# Patient Record
Sex: Male | Born: 1960 | Marital: Married
Health system: Southern US, Community
[De-identification: ages and names within clinical notes are randomized; demographics above are authoritative.]

---

## 2016-08-12 ENCOUNTER — Inpatient Hospital Stay
Admit: 2016-08-12 | Discharge: 2016-08-12 | Disposition: A | Discharge status: Home / Self Care | Attending: Emergency Medicine

## 2016-08-12 DIAGNOSIS — M5441 Lumbago with sciatica, right side: Secondary | ICD-10-CM

## 2016-08-12 MED ORDER — PREDNISONE 20 MG PO TABS
20 MG | ORAL_TABLET | Freq: Every day | ORAL | 0 refills | Status: AC
Start: 2016-08-12 — End: 2016-08-17

## 2016-08-12 MED ORDER — IBUPROFEN 800 MG PO TABS
800 MG | ORAL_TABLET | Freq: Three times a day (TID) | ORAL | 0 refills | Status: AC | PRN
Start: 2016-08-12 — End: 2016-08-19

## 2016-08-12 NOTE — Discharge Instructions (Signed)
Follow-up with her primary care physician or return to the ER if worse in any way.

## 2016-08-12 NOTE — ED Provider Notes (Signed)
Triage Chief Complaint:   Back Pain      HOPI:  Jeffery Powell is a 56 y.o. male that presents for:  HISTORY OF PRESENTING ILLNESS    Chief complaint:  Back pain    Location:  R Lumbar region    Severity:  Moderate    Duration:  Constant in duration    Associated Symptoms:  Patient does have R radicular symptoms, he states that in the past when he sat back flareups that the radicular symptoms have been present in the past as well.  Denies saddle anesthesia, bowel/bladder dysfunction.    Alleviating/Aggravating Factors:  Worse with flexion.    Review of Medical Records Pertinent To Case:  n/a      ROS:    Constitutional: No fevers/chills.  No weight changes.  No night sweats.  Eyes:  No blurry vision or double vision.  No drainage.   Ears, Nose, Throat:  No hearing changes.  No rhinitis.  No sore throat or cough.  Cardiac: No chest pain or pressure.  No arm/jaw pain.  No palpitations.  No lightheadedness.  No claudication symptoms.  Respiratory:  No dyspnea.  No hemoptysis.  GI: No abdominal pain.  No n/v/d.  No hematochezia or melena.   GU:  No hematuria.  No dysuria. No discharge.  MSK:  No joint pain or swelling.  No lower extremity swelling. + Back pain as above.  Skin:  No rashes.  No pruritis.   Neuro:  No numbness, tingling or weakness in any extremity or face.  No headache.  No incoordination.  No speech difficulties.  No seizures.     At least 10 point systems reviewed and otherwise acutely negative except as in the HOPI.    Past Medical History:   Diagnosis Date   . Hyperlipidemia    . Hypertension      No past surgical history on file.  No family history on file.  Social History     Social History   . Marital status: Married     Spouse name: N/A   . Number of children: N/A   . Years of education: N/A     Occupational History   . Not on file.     Social History Main Topics   . Smoking status: Not on file   . Smokeless tobacco: Not on file   . Alcohol use Not on file   . Drug use: Unknown   . Sexual  activity: Not on file     Other Topics Concern   . Not on file     Social History Narrative   . No narrative on file     No current facility-administered medications for this encounter.      Current Outpatient Prescriptions   Medication Sig Dispense Refill   . ibuprofen (ADVIL;MOTRIN) 800 MG tablet Take 1 tablet by mouth every 8 hours as needed for Pain 20 tablet 0   . predniSONE (DELTASONE) 20 MG tablet Take 3 tablets by mouth daily for 5 days 15 tablet 0     Allergies   Allergen Reactions   . Aspirin Anaphylaxis     Nursing Notes Reviewed    Physical Exam:  ED Triage Vitals [08/12/16 1137]   Enc Vitals Group      BP (!) 169/112      Pulse 84      Resp 16      Temp 98.1 F (36.7 C)      Temp Source Oral  SpO2 98 %      Weight 220 lb (99.8 kg)      Height 6' (1.829 m)      Head Circumference       Peak Flow       Pain Score       Pain Loc       Pain Edu?       Excl. in GC?      GENERAL APPEARANCE: alert, lying supine in gurney, cooperative with exam.  HEAD: normocephalic, atraumatic  EYES:  EOMI, conjunctiva clear.  NOSE: no nasal discharge.  MOUTH: moist mucous membranes  NECK: supple, no neck tenderness, normal ROM, no JVD  BACK: no back tenderness. no CVA tenderness       No saddle anesthesia.  2+patellar reflexes.  Strong plantar and dorsiflexion.  5/5 strength throughout lower extremity and full sensation throughout all dermatomes.  Neg straight and cross leg testing.  Musculoskeletal exam reveals no ttp or step offs along the midline spinous processes.   I can easily palpate a R lower lumbar paraspinal spasm which reproduces the patient's pain.  Negative straight and cross leg testing.  Patient defers rectal.  Able to ambulate without limp and without assistance.  Vascular exam wnl with strong and symmetric pulses proximally and distally, left to right.     LUNGS: no wheezing, no rales, no rhonchi, no accessory muscle use. good air  exchange bilateral.  HEART: normal rate, normal rhythm, no murmur, no  rub.  ABDOMEN: normal appearance, normal BS, soft, no abd tenderness, no guarding, no  organomegaly, no abd masses.  RECTAL: deffered  EXTREMITIES: no joint swelling\tenderness, no edema, normal ROM.  SKIN: warm, dry, good color, no rash.  NEURO: Alert and oriented, motor intact, sensory intact.    I have reviewed and interpreted all of the currently available lab results from this visit (if applicable):  No results found for this visit on 08/12/16.     Radiographs:  []  Radiologist's Report Reviewed:   No orders to display       EKG: (All EKG's are interpreted by myself in the absence of a cardiologist)    MDM:    BACK PAIN:    Neuro intact. No urinary retention or incont. Suspect MS strain vs. Disc herniation. Low risk for hematoma, abscess, pathologic fracture, mass. Do not suspect cord compression given normal exam.     No evid/hx sugg cauda eq/aaa/transv my/abscess ,spondylitis, hematoma, fracture, mass/malignancy, spinal artery infarct,  osteomyelitis/diskitis, spinal stenosis.      Discussed stretching exercises, early return to function.     As above it is unlikely that this patient has a serious or sinister condition causing the symptoms and most likely musculoskeletal v. possible disc herniation without evidence of cord compression- no focal neurological signs and no clinical suspicion for cauda equine, trauma, chronic steroid use, immunocompromise, autoimmune/connective tissue disorders, IVDU, anticoagulation use, no pain at rest.    Final Impression:  1. Acute right-sided low back pain with right-sided sciatica    2. Sciatica of right side        Discharge referral (if applicable):  No follow-up provider specified.    Discharge medications (if applicable):  New Prescriptions    IBUPROFEN (ADVIL;MOTRIN) 800 MG TABLET    Take 1 tablet by mouth every 8 hours as needed for Pain    PREDNISONE (DELTASONE) 20 MG TABLET    Take 3 tablets by mouth daily for 5 days       (Please  note that portions of this note may  have been completed with a voice recognition program. Efforts were made to edit the dictations but occasionally words are mis-transcribed.)    Elissa Heftyearikirangi E Chirstopher Iovino, MD              Elissa Heftyearikirangi E Cheikh Bramble, MD  08/12/16 1216

## 2016-08-12 NOTE — ED Notes (Signed)
D/c stable with scripts and follow up     Tresa EndoKelly A. Lottie MusselVanscoy, RN  08/12/16 1231

## 2016-08-12 NOTE — Other (Unsigned)
Patient Acct Nbr: [id number]   Primary AUTH/CERT:   Primary Insurance Company Name: [employer]  Primary Insurance Plan name: [employer]  Primary Insurance Group Number: [id number]  Primary Insurance Plan Type: Health  Primary Insurance Policy Number: [id number]

## 2017-10-27 DIAGNOSIS — E785 Hyperlipidemia, unspecified: Secondary | ICD-10-CM | POA: Insufficient documentation

## 2017-10-27 DIAGNOSIS — I10 Essential (primary) hypertension: Secondary | ICD-10-CM | POA: Insufficient documentation

## 2019-02-01 ENCOUNTER — Ambulatory Visit: Payer: Self-pay | Admitting: Family Medicine

## 2019-04-19 ENCOUNTER — Encounter: Payer: BLUE CROSS/BLUE SHIELD | Admitting: Family Medicine

## 2019-04-26 ENCOUNTER — Other Ambulatory Visit: Payer: Self-pay

## 2019-04-26 ENCOUNTER — Ambulatory Visit (INDEPENDENT_AMBULATORY_CARE_PROVIDER_SITE_OTHER): Payer: BLUE CROSS/BLUE SHIELD | Admitting: Family Medicine

## 2019-04-26 ENCOUNTER — Encounter: Payer: Self-pay | Admitting: Family Medicine

## 2019-04-26 VITALS — BP 142/94 | HR 72

## 2019-04-26 DIAGNOSIS — E785 Hyperlipidemia, unspecified: Secondary | ICD-10-CM | POA: Diagnosis not present

## 2019-04-26 DIAGNOSIS — R5383 Other fatigue: Secondary | ICD-10-CM

## 2019-04-26 DIAGNOSIS — M546 Pain in thoracic spine: Secondary | ICD-10-CM | POA: Diagnosis not present

## 2019-04-26 DIAGNOSIS — M545 Low back pain, unspecified: Secondary | ICD-10-CM

## 2019-04-26 DIAGNOSIS — G8929 Other chronic pain: Secondary | ICD-10-CM | POA: Insufficient documentation

## 2019-04-26 DIAGNOSIS — Z0000 Encounter for general adult medical examination without abnormal findings: Secondary | ICD-10-CM

## 2019-04-26 DIAGNOSIS — I10 Essential (primary) hypertension: Secondary | ICD-10-CM

## 2019-04-26 DIAGNOSIS — E349 Endocrine disorder, unspecified: Secondary | ICD-10-CM

## 2019-04-26 NOTE — Progress Notes (Signed)
Office Visit Note   Patient: John Frost           Date of Birth: November 09, 1960           MRN: [id number] Visit Date: 04/26/2019 Requested by: No referring provider defined for this encounter. PCP: Eunice Blase, MD  Subjective: Chief Complaint  Patient presents with  . establish primary care/bloodwork for lipids    HPI: He is here to establish care.  He is due for a wellness exam with labs.  He has a history of hypertension and hyperlipidemia.  He is on lisinopril and Lipitor for these.  Generally his blood pressure is well controlled.  He had an issue with left-sided chest pain for years ago.  It was severe and he went by EMS to the hospital and heart attack was ruled out.  He had a stress echocardiogram last summer which was also normal.  He does have a family history of heart disease.  Otherwise he has a history of testosterone deficiency and did well with treatment until his provider left and the new provider did not address it.  He would like to look into that again if possible.  He does complain of chronic mild fatigue and decreased libido.  He has chronic thoracic and lumbar back pain.  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  He has worked with Dr. Jimmye Norman in the past but did not notice that much difference.  He uses a inversion table at home.  Occasionally he gets right-sided radicular pain.  Today he is feeling pretty well.  His job requires frequent traveling to different job sites and he is sometimes gone for several weeks.  Family history was updated.  He had colonoscopy in 2016 which was normal.  He is up-to-date on eye exams and dental exams.                ROS:   All other systems were reviewed and are negative.  Objective: Vital Signs: BP (!) 142/94   Pulse 72   Physical Exam:  General:  Alert and oriented, in no acute distress. Pulm:  Breathing unlabored. Psy:  Normal mood,  congruent affect. Skin: No suspicious lesions. HEENT:  [place]/AT, PERRLA, EOM Full, no nystagmus.  Funduscopic examination within normal limits.  No conjunctival erythema.  Tympanic membranes are pearly gray with normal landmarks.  External ear canals are normal.  Nasal passages are clear.  Oropharynx is clear.  No significant lymphadenopathy.  No thyromegaly or nodules.  2+ carotid pulses without bruits. CV: Regular rate and rhythm without murmurs, rubs, or gallops.  No peripheral edema.  2+ radial and posterior tibial pulses. Lungs: Clear to auscultation throughout with no wheezing or areas of consolidation. Abd: Bowel sounds are active, no hepatosplenomegaly or masses.  Soft and nontender.  No audible bruits.  No evidence of ascites. Back: He has multiple tender trigger points in the thoracic area.  He has midline L5-S1 tenderness.  Straight leg raise, negative stork test.   Imaging: None today  Assessment & Plan: 1.  Wellness examination -Labs today.  2.  Hypertension -Call for refills when needed.  3.  Hyperlipidemia -Tolerating statin.  4.  Testosterone deficiency -Recheck levels today. -Emphasized importance of lifestyle changes, but may resume topical testosterone as well.  5.  Chronic thoracic and lumbar back pain -Home core strengthening exercises given today. -He will contact me for physical therapy referral near where  he lives if adequate progress.     Procedures: No procedures performed  No notes on file     PMFS History: Patient Active Problem List   Diagnosis Date Noted  . Chronic thoracic back pain 04/26/2019  . Chronic midline low back pain without sciatica 04/26/2019  . Testosterone deficiency 04/26/2019  . Essential hypertension 10/27/2017  . Hyperlipidemia 10/27/2017   History reviewed. No pertinent past medical history.  Family History  Problem Relation Age of Onset  . Hypertension Mother   . Hyperlipidemia Mother   . Breast cancer Mother   .  Cancer Mother   . Hypertension Father   . Hyperlipidemia Father   . Stroke Brother   . Heart attack Brother   . Stroke Maternal Grandmother   . Heart attack Maternal Grandfather   . Prostate cancer Neg Hx   . Colon cancer Neg Hx     History reviewed. No pertinent surgical history. Social History   Occupational History  . Not on file  Tobacco Use  . Smoking status: Not on file  Substance and Sexual Activity  . Alcohol use: Not on file  . Drug use: Not on file  . Sexual activity: Not on file

## 2019-04-27 LAB — COMPREHENSIVE METABOLIC PANEL
AG Ratio: 1.6 (calc) (ref 1.0–2.5)
ALT: 24 U/L (ref 9–46)
AST: 23 U/L (ref 10–35)
Albumin: 4.1 g/dL (ref 3.6–5.1)
Alkaline phosphatase (APISO): 88 U/L (ref 35–144)
BUN: 9 mg/dL (ref 7–25)
CO2: 24 mmol/L (ref 20–32)
Calcium: 9.2 mg/dL (ref 8.6–10.3)
Chloride: 107 mmol/L (ref 98–110)
Creat: 0.85 mg/dL (ref 0.70–1.33)
Globulin: 2.6 g/dL (calc) (ref 1.9–3.7)
Glucose, Bld: 95 mg/dL (ref 65–99)
Potassium: 4.4 mmol/L (ref 3.5–5.3)
Sodium: 141 mmol/L (ref 135–146)
Total Bilirubin: 0.9 mg/dL (ref 0.2–1.2)
Total Protein: 6.7 g/dL (ref 6.1–8.1)

## 2019-04-27 LAB — LIPID PANEL
Cholesterol: 138 mg/dL (ref <200)
HDL: 42 mg/dL (ref >=40)
LDL Cholesterol (Calc): 79 mg/dL (calc)
Non-HDL Cholesterol (Calc): 96 mg/dL (calc) (ref <130)
Total CHOL/HDL Ratio: 3.3 (calc) (ref <5.0)
Triglycerides: 89 mg/dL (ref <150)

## 2019-04-27 LAB — THYROID PANEL WITH TSH
Free Thyroxine Index: 2.5 (ref 1.4–3.8)
T3 Uptake: 35 % (ref 22–35)
T4, Total: 7.2 ug/dL (ref 4.9–10.5)
TSH: 1.12 mIU/L (ref 0.40–4.50)

## 2019-04-27 LAB — CBC WITH DIFFERENTIAL/PLATELET
Absolute Monocytes: 384 cells/uL (ref 200–950)
Basophils Absolute: 40 cells/uL (ref 0–200)
Basophils Relative: 1 %
Eosinophils Absolute: 212 cells/uL (ref 15–500)
Eosinophils Relative: 5.3 %
HCT: 43.1 % (ref 38.5–50.0)
Hemoglobin: 15.3 g/dL (ref 13.2–17.1)
Lymphs Abs: 1012 cells/uL (ref 850–3900)
MCH: 32.8 pg (ref 27.0–33.0)
MCHC: 35.5 g/dL (ref 32.0–36.0)
MCV: 92.5 fL (ref 80.0–100.0)
MPV: 10.3 fL (ref 7.5–12.5)
Monocytes Relative: 9.6 %
Neutro Abs: 2352 cells/uL (ref 1500–7800)
Neutrophils Relative %: 58.8 %
Platelets: 197 10*3/uL (ref 140–400)
RBC: 4.66 10*6/uL (ref 4.20–5.80)
RDW: 13.1 % (ref 11.0–15.0)
Total Lymphocyte: 25.3 %
WBC: 4 10*3/uL (ref 3.8–10.8)

## 2019-04-27 LAB — HIGH SENSITIVITY CRP: hs-CRP: 2.3 mg/L

## 2019-04-27 LAB — TESTOSTERONE TOTAL,FREE,BIO, MALES
Albumin: 4.1 g/dL (ref 3.6–5.1)
Sex Hormone Binding: 22 nmol/L (ref 22–77)
Testosterone, Bioavailable: 85.8 ng/dL — ABNORMAL LOW (ref >=110.0)
Testosterone, Free: 45.6 pg/mL — ABNORMAL LOW (ref 46.0–224.0)
Testosterone: 259 ng/dL (ref 250–827)

## 2019-04-27 LAB — VITAMIN D 25 HYDROXY (VIT D DEFICIENCY, FRACTURES): Vit D, 25-Hydroxy: 54 ng/mL (ref 30–100)

## 2019-04-27 LAB — PSA: PSA: 0.2 ng/mL (ref <=4.0)

## 2019-04-28 ENCOUNTER — Telehealth: Payer: Self-pay | Admitting: Family Medicine

## 2019-04-28 NOTE — Telephone Encounter (Signed)
Left message on mobile voice mail to call back. 

## 2019-04-28 NOTE — Telephone Encounter (Signed)
Testosterone levels are moderately low.  Could contemplate treatment if desired.  All else looks good.

## 2019-04-30 NOTE — Telephone Encounter (Signed)
I called and advised the patient of his results. Mailing a copy of the results to his home address per request. Advised him to call us back if he would like to try treatment for the low testosterone levels.

## 2019-11-16 ENCOUNTER — Other Ambulatory Visit: Payer: Self-pay

## 2019-11-16 ENCOUNTER — Encounter: Payer: Self-pay | Admitting: Family Medicine

## 2019-11-16 ENCOUNTER — Ambulatory Visit (INDEPENDENT_AMBULATORY_CARE_PROVIDER_SITE_OTHER): Payer: BLUE CROSS/BLUE SHIELD | Admitting: Family Medicine

## 2019-11-16 DIAGNOSIS — G8929 Other chronic pain: Secondary | ICD-10-CM

## 2019-11-16 DIAGNOSIS — M5441 Lumbago with sciatica, right side: Secondary | ICD-10-CM | POA: Diagnosis not present

## 2019-11-16 MED ORDER — IBUPROFEN 800 MG PO TABS: 800.0000 mg | ORAL_TABLET | Freq: Three times a day (TID) | ORAL | 1 refills | Status: DC | PRN

## 2019-11-16 MED ORDER — BACLOFEN 10 MG PO TABS: 5.0000 mg | ORAL_TABLET | Freq: Every evening | ORAL | 3 refills | Status: DC | PRN

## 2019-11-16 NOTE — Progress Notes (Signed)
Office Visit Note   Patient: John Frost           Date of Birth: 04-23-1960           MRN: [id number] Visit Date: 11/16/2019 Requested by: Lavada Mesi, MD [address] [place],  [place] [address] PCP: Lavada Mesi, MD  Subjective: Chief Complaint  Patient presents with  . Lower Back - Pain    Pain in the lower back - has gotten "severe" over the past month. Radiates down the right leg. Took 1 Ibuprofen 800 mg 1 week ago - helped at first, but not as effective now.    HPI: He is here with back pain.  Longstanding intermittent problems with his back, but over the past 3 to 4 years it has gotten steadily worse.  Pain between the shoulder blades and also in the lower back with occasional radiation down the right leg.  In the past he had several injuries, one of them a bunch of steel landed on him.  He underwent extensive imaging and nothing showed up.  He has tried chiropractic per Dr. Mayford Knife in the past.  He has not taken any medications for probably 15 years, but in the past couple weeks he has resorted to ibuprofen.  He had prescription grade 800 mg which helped, but over-the-counter does not seem to be giving him the same relief.               ROS:   All other systems were reviewed and are negative.  Objective: Vital Signs: There were no vitals taken for this visit.  Physical Exam:  General:  Alert and oriented, in no acute distress. Pulm:  Breathing unlabored. Psy:  Normal mood, congruent affect. Skin: No rash Back: He has bilateral rhomboid area trigger points reproducing his shoulder blade pain.  Lower back slightly tender in the L5-S1 area, negative straight leg raise with 5/5 lower extremity strength and 2+ DTRs.  Imaging: No results found.  Assessment & Plan: 1.  Chronic upper and lower back pain, possibly myofascial. -We will try physical therapy in [place], where he spends most of his time working lately. -Ibuprofen and baclofen as needed. -We will order  new x-rays and possibly MRI scan if his symptoms do not improve.     Procedures: No procedures performed  No notes on file     PMFS History: Patient Active Problem List   Diagnosis Date Noted  . Chronic thoracic back pain 04/26/2019  . Chronic midline low back pain without sciatica 04/26/2019  . Testosterone deficiency 04/26/2019  . Essential hypertension 10/27/2017  . Hyperlipidemia 10/27/2017   History reviewed. No pertinent past medical history.  Family History  Problem Relation Age of Onset  . Hypertension Mother   . Hyperlipidemia Mother   . Breast cancer Mother   . Cancer Mother   . Hypertension Father   . Hyperlipidemia Father   . Stroke Brother   . Heart attack Brother   . Stroke Maternal Grandmother   . Heart attack Maternal Grandfather   . Prostate cancer Neg Hx   . Colon cancer Neg Hx     History reviewed. No pertinent surgical history. Social History   Occupational History  . Not on file  Tobacco Use  . Smoking status: Not on file  Substance and Sexual Activity  . Alcohol use: Not on file  . Drug use: Not on file  . Sexual activity: Not on file

## 2019-12-06 ENCOUNTER — Other Ambulatory Visit: Payer: Self-pay | Admitting: Family Medicine

## 2019-12-06 DIAGNOSIS — G8929 Other chronic pain: Secondary | ICD-10-CM

## 2019-12-06 DIAGNOSIS — M5441 Lumbago with sciatica, right side: Secondary | ICD-10-CM

## 2019-12-06 MED ORDER — PREDNISONE 10 MG PO TABS: ORAL_TABLET | ORAL | 0 refills | Status: AC

## 2019-12-06 NOTE — Progress Notes (Signed)
Having severe pain despite being in PT.  Will order x-rays and MRI scan.  Trial of prednisone.

## 2019-12-24 ENCOUNTER — Ambulatory Visit
Admission: RE | Admit: 2019-12-24 | Discharge: 2019-12-24 | Disposition: A | Discharge status: Home / Self Care | Payer: BLUE CROSS/BLUE SHIELD | Source: Ambulatory Visit | Attending: Family Medicine | Admitting: Family Medicine

## 2019-12-24 DIAGNOSIS — G8929 Other chronic pain: Secondary | ICD-10-CM

## 2019-12-25 ENCOUNTER — Other Ambulatory Visit: Payer: Self-pay

## 2019-12-25 ENCOUNTER — Ambulatory Visit
Admission: RE | Admit: 2019-12-25 | Discharge: 2019-12-25 | Disposition: A | Discharge status: Home / Self Care | Payer: BLUE CROSS/BLUE SHIELD | Source: Ambulatory Visit | Attending: Family Medicine | Admitting: Family Medicine

## 2019-12-25 DIAGNOSIS — M5441 Lumbago with sciatica, right side: Secondary | ICD-10-CM

## 2019-12-25 DIAGNOSIS — G8929 Other chronic pain: Secondary | ICD-10-CM

## 2019-12-27 ENCOUNTER — Telehealth: Payer: Self-pay | Admitting: Family Medicine

## 2019-12-27 DIAGNOSIS — G8929 Other chronic pain: Secondary | ICD-10-CM

## 2019-12-27 DIAGNOSIS — M5441 Lumbago with sciatica, right side: Secondary | ICD-10-CM

## 2019-12-27 NOTE — Telephone Encounter (Signed)
MRI shows mild degenerative changes at multiple levels with some disc bulges.  There is some narrowing of the spinal canal and nerve openings at the L3-4 level and the L5-S1 (and L4-5) levels, but no nerve impingement.  No indication for surgery at this point.  If pain doesn't improve, could consider referral for an epidural steroid injection.

## 2020-01-03 NOTE — Addendum Note (Signed)
Addended by: Lillia Carmel on: 01/03/2020 07:55 AM   Modules accepted: Orders

## 2020-01-06 ENCOUNTER — Telehealth: Payer: Self-pay

## 2020-01-06 NOTE — Telephone Encounter (Signed)
Patient called back returning missed call 

## 2020-01-06 NOTE — Telephone Encounter (Signed)
Called pt and sch. Done

## 2020-01-10 ENCOUNTER — Encounter: Payer: Self-pay | Admitting: Family Medicine

## 2020-01-10 MED ORDER — ATORVASTATIN CALCIUM 40 MG PO TABS: 40.0000 mg | ORAL_TABLET | Freq: Every day | ORAL | 1 refills | Status: DC

## 2020-01-11 ENCOUNTER — Other Ambulatory Visit: Payer: Self-pay | Admitting: Family Medicine

## 2020-01-25 ENCOUNTER — Telehealth: Payer: Self-pay

## 2020-01-25 NOTE — Telephone Encounter (Signed)
Pt cancel appt because he doesn't want the inj.

## 2020-01-25 NOTE — Telephone Encounter (Signed)
Patient called in cancel appt with dr Alvester Morin

## 2020-02-01 ENCOUNTER — Ambulatory Visit: Payer: BLUE CROSS/BLUE SHIELD | Admitting: Physical Medicine and Rehabilitation

## 2020-03-07 ENCOUNTER — Other Ambulatory Visit: Payer: Self-pay | Admitting: Family Medicine

## 2020-03-10 ENCOUNTER — Other Ambulatory Visit: Payer: Self-pay | Admitting: Family Medicine

## 2020-05-08 ENCOUNTER — Other Ambulatory Visit: Payer: Self-pay | Admitting: Family Medicine

## 2020-07-02 ENCOUNTER — Other Ambulatory Visit: Payer: Self-pay | Admitting: Family Medicine

## 2020-07-07 ENCOUNTER — Other Ambulatory Visit: Payer: Self-pay | Admitting: Family Medicine

## 2020-09-11 ENCOUNTER — Other Ambulatory Visit: Payer: Self-pay | Admitting: Family Medicine

## 2020-11-07 ENCOUNTER — Other Ambulatory Visit: Payer: Self-pay | Admitting: Family Medicine

## 2021-05-18 IMAGING — MR MR LUMBAR SPINE W/O CM
4 of 5 series · 26 of 48 positions shown · non-contrast
Comparison: None.

CLINICAL DATA: Lumbar radiculopathy.

EXAM:
MRI LUMBAR SPINE WITHOUT CONTRAST
TECHNIQUE: Multiplanar, multisequence MR imaging of the lumbar spine was
performed. No intravenous contrast was administered.

[Series 3: T2 · sagittal · 4.0mm · 1.09mm/px · 6 of 17 slices shown (1 of 2)]
[im 1/17]
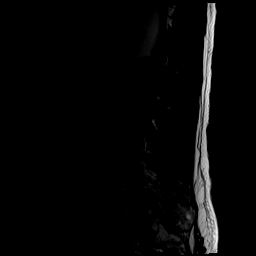
[im 4/17]
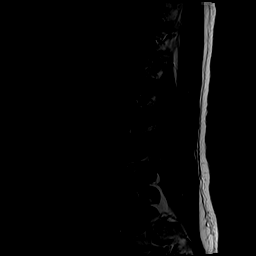
[im 7/17]
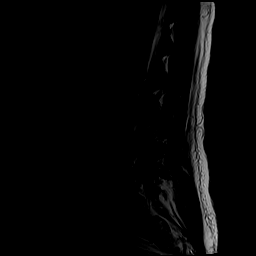
[im 10/17]
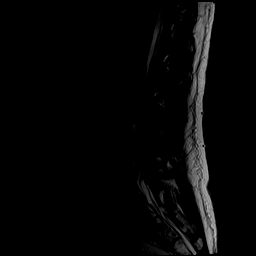
[im 13/17]
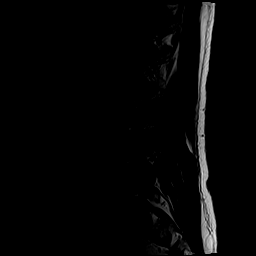
[im 17/17]
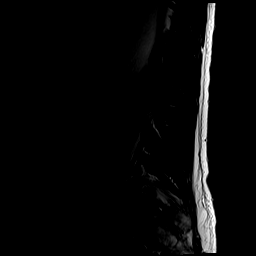

[Series 5: T1 · sagittal · 4.0mm · 1.09mm/px · 5 of 17 slices shown (1 of 2)]
[im 1/17]
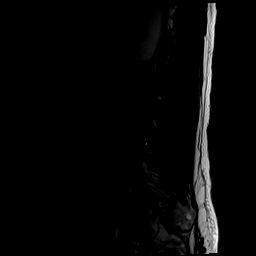
[im 5/17]
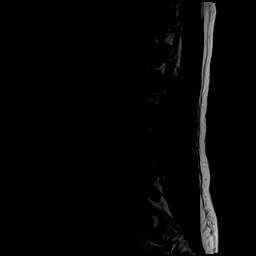
[im 9/17]
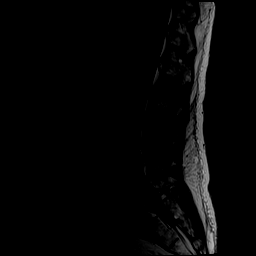
[im 13/17]
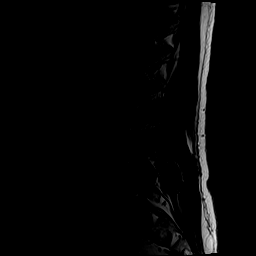
[im 17/17]
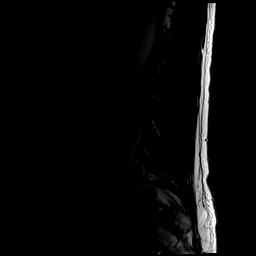

[Series 6: T2 · axial · 4.0mm · 0.39mm/px · z∈[-69,+198]mm · 10 of 54 slices shown (2 of 2)]
[im 4/54]
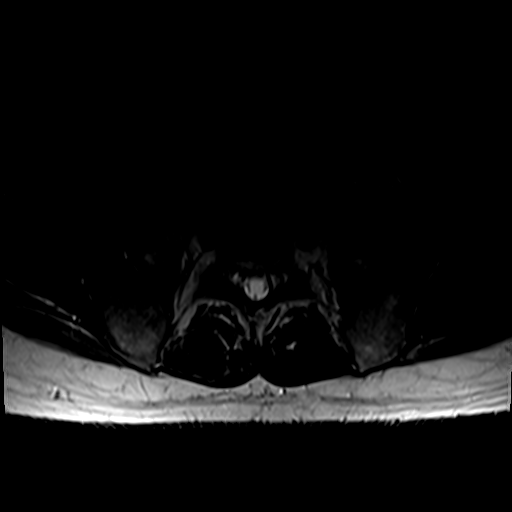
[im 8/54]
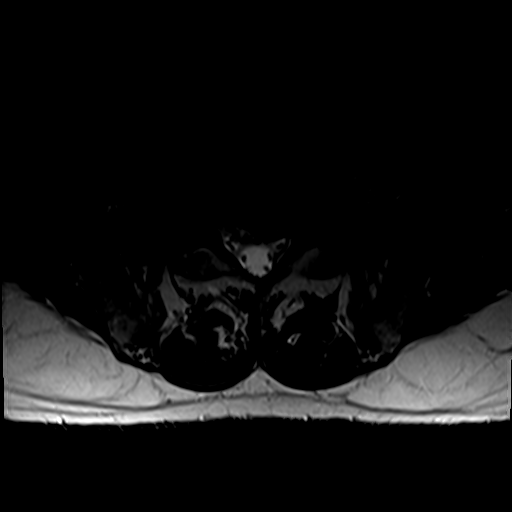
[im 11/54]
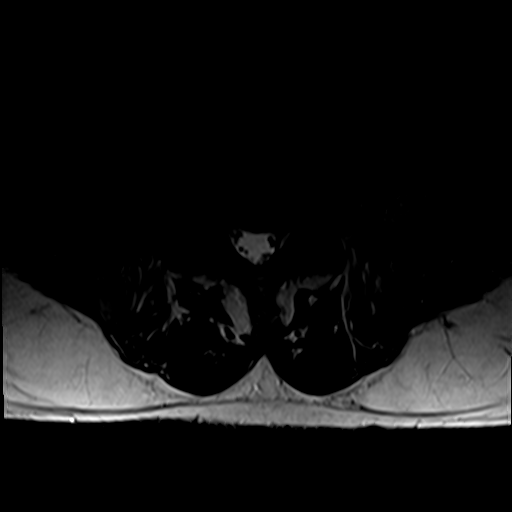
[im 18/54]
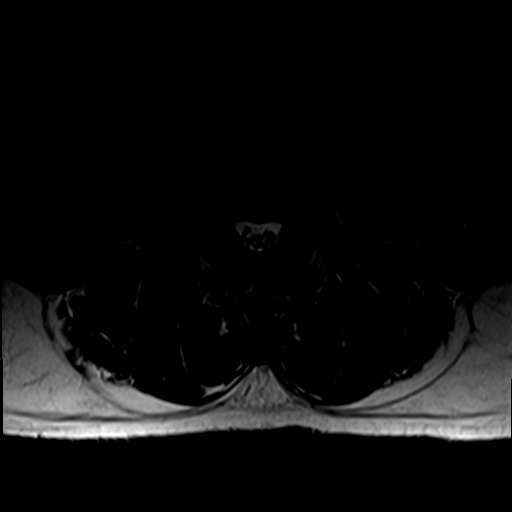
[im 25/54]
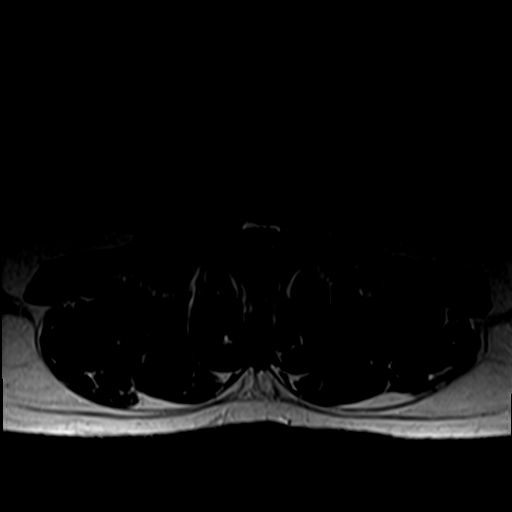
[im 29/54]
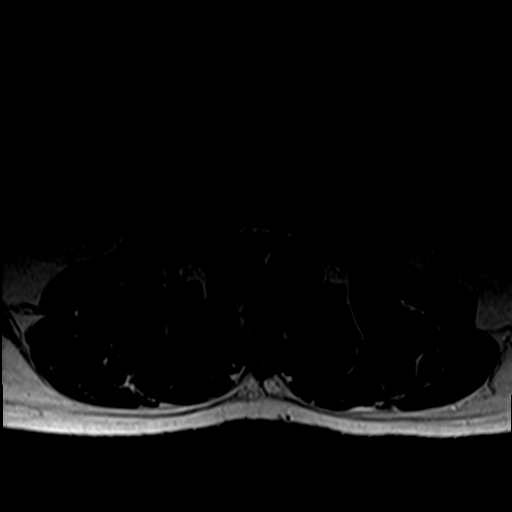
[im 32/54]
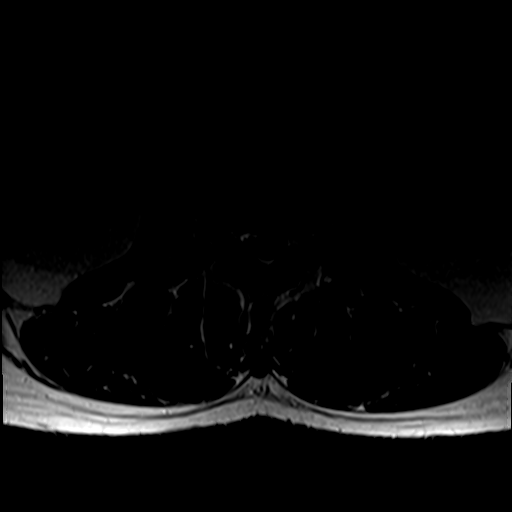
[im 39/54]
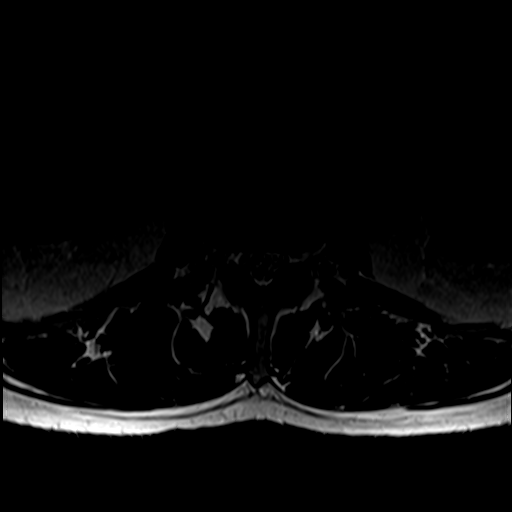
[im 46/54]
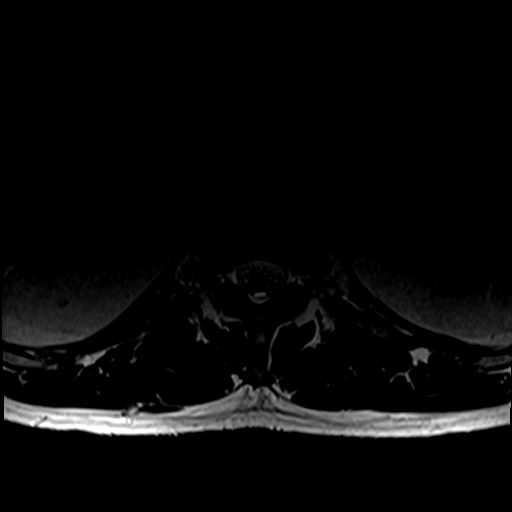
[im 54/54]
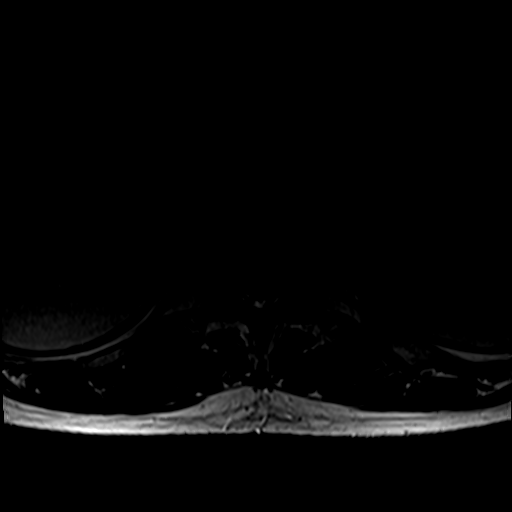

[Series 7: T1 · axial · 4.0mm · 0.39mm/px · z∈[-69,+150]mm · 5 of 54 slices shown (2 of 2)]
[im 4/54]
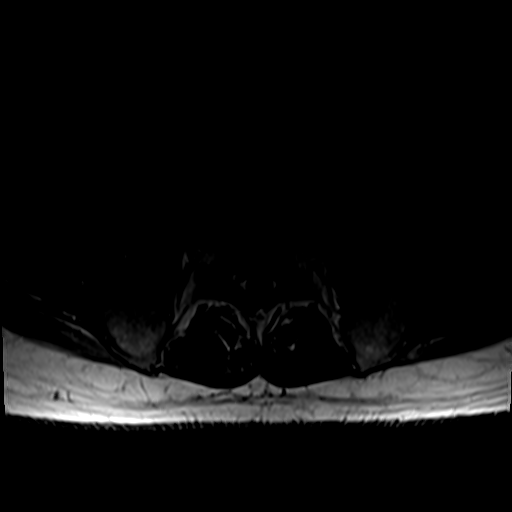
[im 8/54]
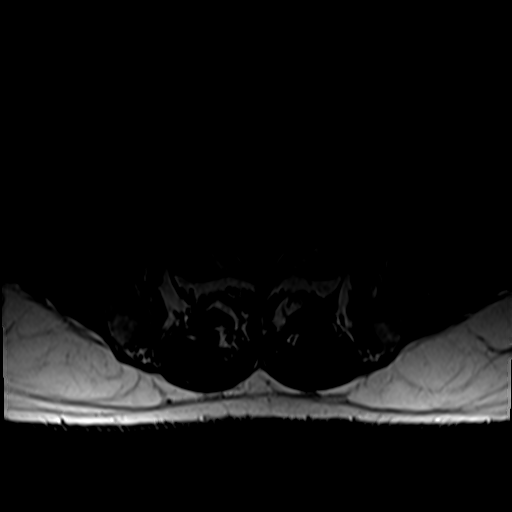
[im 11/54]
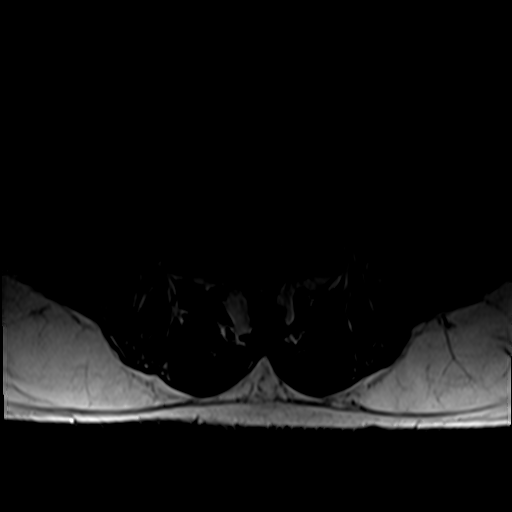
[im 29/54]
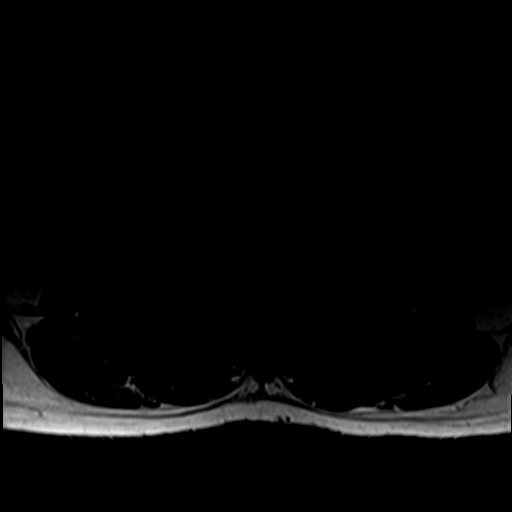
[im 46/54]
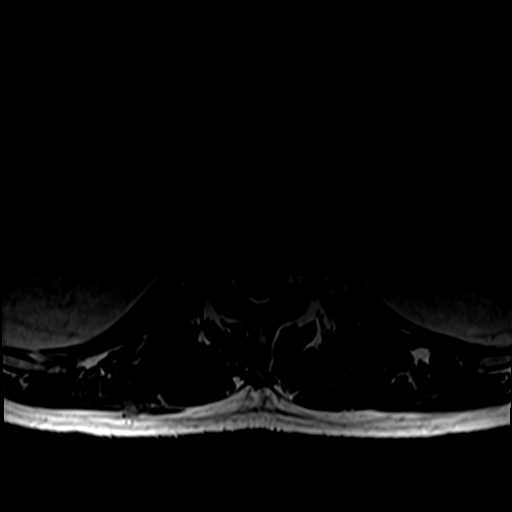

[26 of 48 positions shown; findings below may reference images not displayed]

FINDINGS: Segmentation:  Standard.

Alignment: Small retrolisthesis of L5 over S1. Endplate degenerative
changes at L5-S1. Congenitally small spinal canal.

Vertebrae:  No fracture, evidence of discitis, or bone lesion.

Conus medullaris and cauda equina: Conus extends to the L1 level.
Conus and cauda equina appear normal.

Paraspinal and other soft tissues: Negative.

Disc levels:

T12-L1: No spinal canal or neural foraminal stenosis.

L1-2: Shallow disc bulge and mild facet degenerative changes without
significant spinal canal or neural foraminal stenosis.

L2-3: Shallow disc bulge and mild facet degenerative changes
resulting in mild spinal canal stenosis with narrowing of the
bilateral subarticular zones and mild bilateral neural foraminal
narrowing.

L3-4: Disc bulge, facet degenerative changes and ligamentum flavum
redundancy resulting in mild-to-moderate spinal canal stenosis and
mild bilateral neural foraminal narrowing.

L4-5: Disc bulge, facet degenerative changes and ligamentum flavum
redundancy resulting in narrowing of the bilateral subarticular
zones and mild to moderate bilateral neural foraminal narrowing. No
significant spinal canal stenosis.

L5-S1: Disc bulge with superimposed small central disc protrusion
and mild facet degenerative changes resulting in moderate bilateral
neural foraminal narrowing. No significant spinal canal stenosis.
IMPRESSION: 1. Mild multilevel degenerative changes of the lumbar spine with
mild-to-moderate spinal canal stenosis at L3-4 and mild spinal canal
stenosis at L2-3.
2. Moderate bilateral neural foraminal narrowing at L5-S1 and mild
to moderate bilateral neural foraminal narrowing at L4-5.
